# Patient Record
Sex: Male | Born: 1955 | Race: White | Hispanic: No | Marital: Married | Smoking: Never smoker
Health system: Southern US, Community
[De-identification: ages and names within clinical notes are randomized; demographics above are authoritative.]

---

## 2005-03-10 ENCOUNTER — Other Ambulatory Visit: Payer: Self-pay

## 2005-03-10 ENCOUNTER — Emergency Department: Payer: Self-pay | Admitting: Unknown Physician Specialty

## 2005-03-11 ENCOUNTER — Ambulatory Visit: Payer: Self-pay | Admitting: Unknown Physician Specialty

## 2007-08-11 IMAGING — CT CT ABD-PELV W/O CM
1 of 2 series · 15 of 32 positions shown, 19 images · non-contrast
Comparison: none

REASON FOR EXAM: Abdominal pain
COMMENTS:

[Series 2: stone · axial · 0.67mm/px · z∈[-480,-30]mm · 15 of 168 slices shown, 19 images]
[im 12/168  soft-tissue]
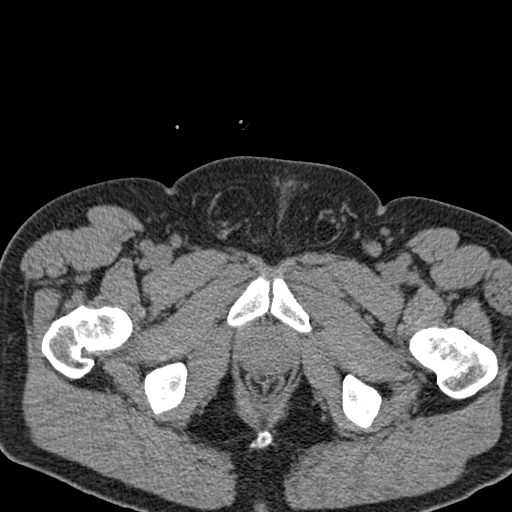
[im 12/168  bone]
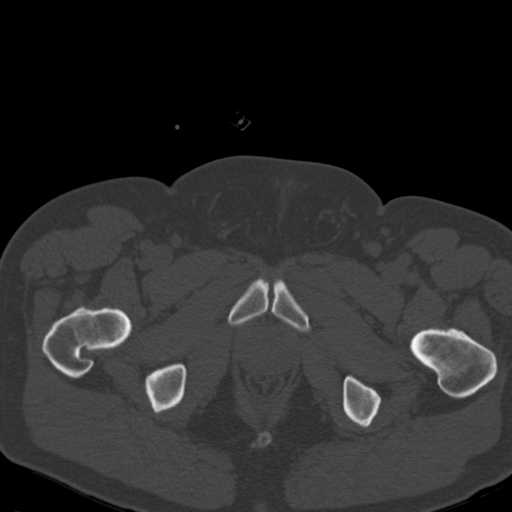
[im 24/168  soft-tissue]
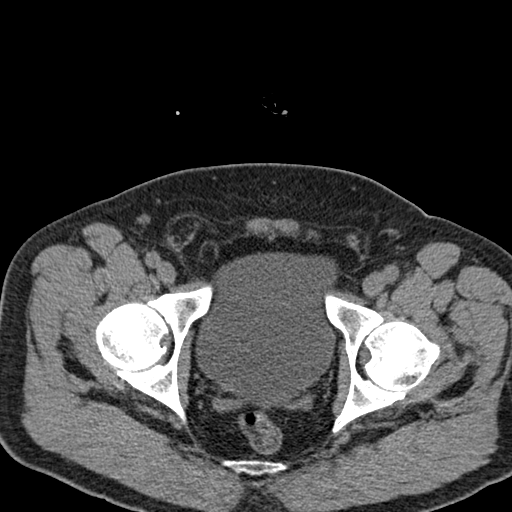
[im 36/168  soft-tissue]
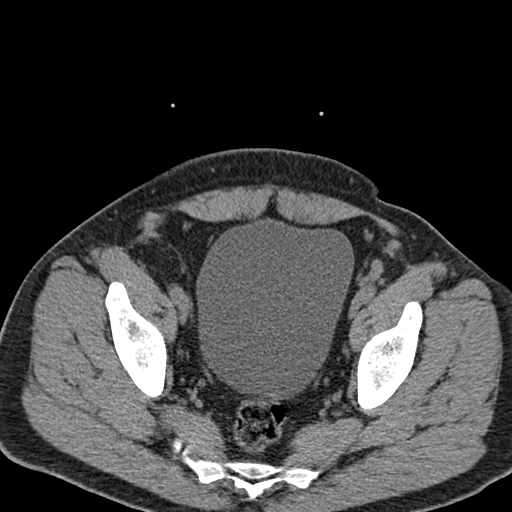
[im 48/168  soft-tissue]
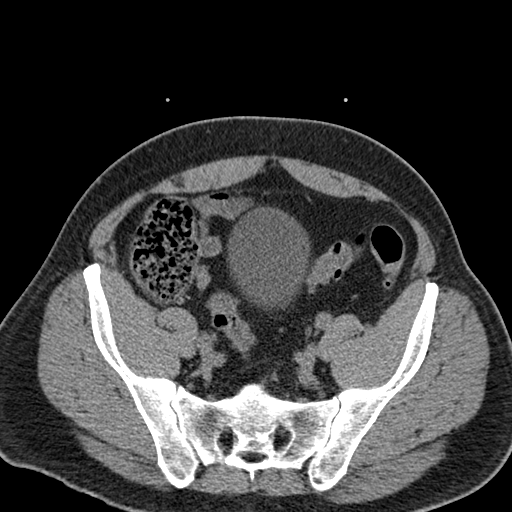
[im 60/168  soft-tissue]
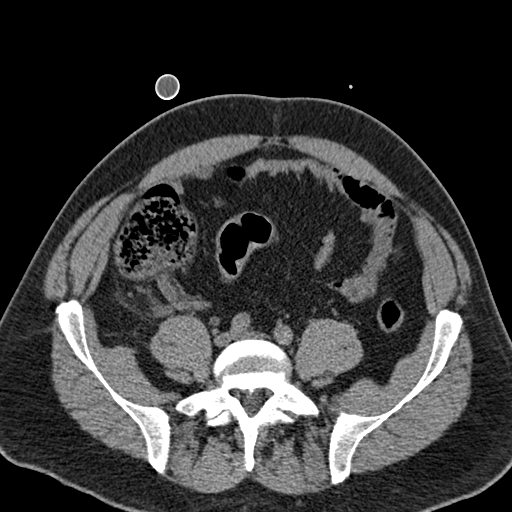
[im 72/168  soft-tissue]
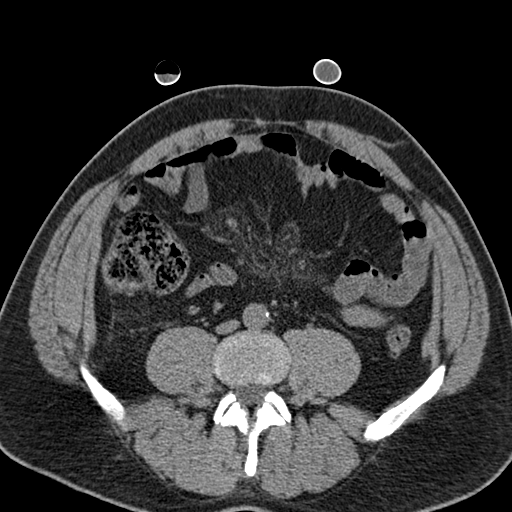
[im 84/168  soft-tissue]
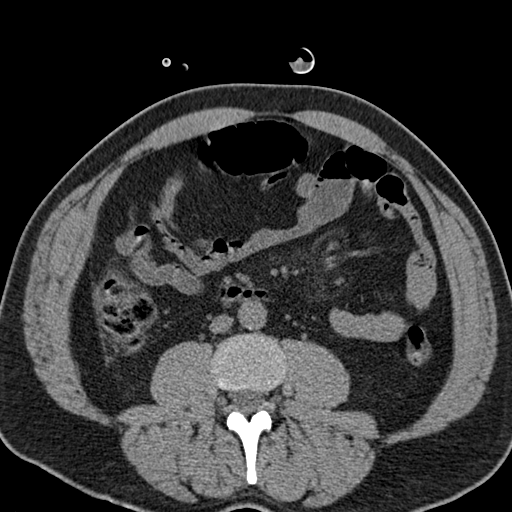
[im 96/168  soft-tissue]
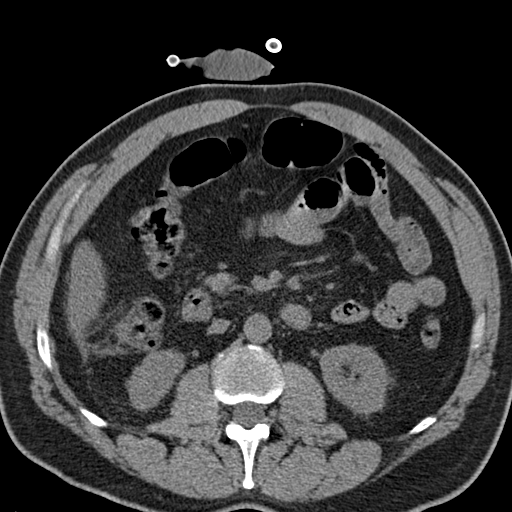
[im 108/168  soft-tissue]
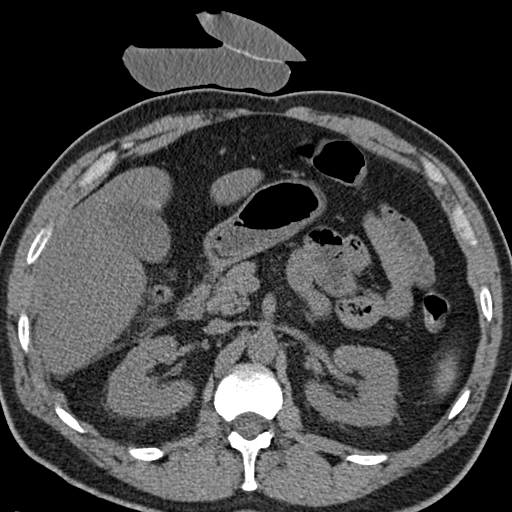
[im 108/168  bone]
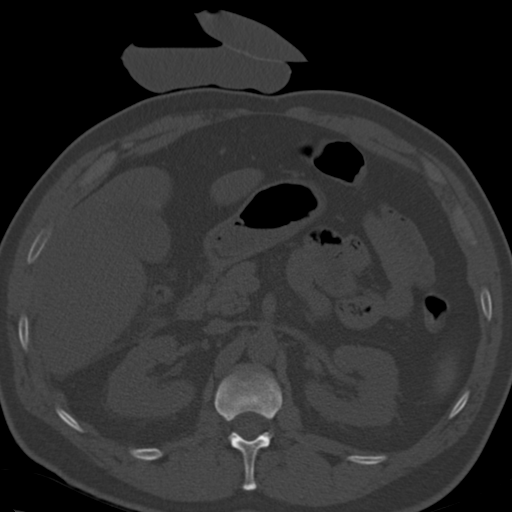
[im 120/168  soft-tissue]
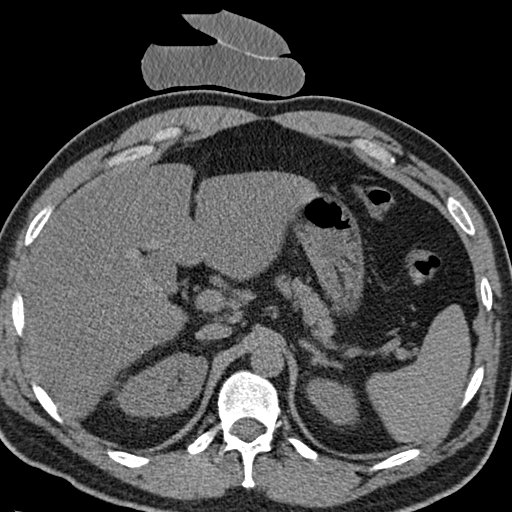
[im 132/168  soft-tissue]
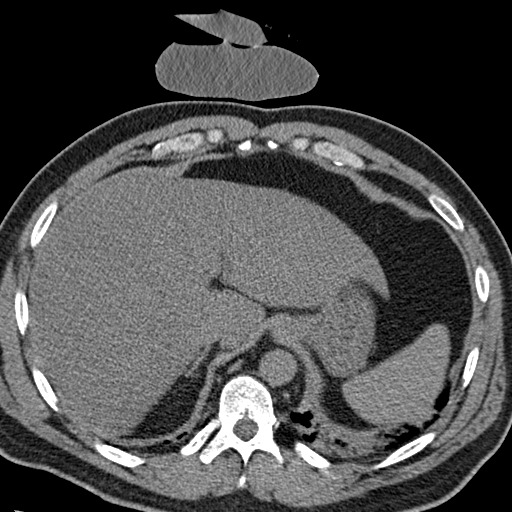
[im 144/168  soft-tissue]
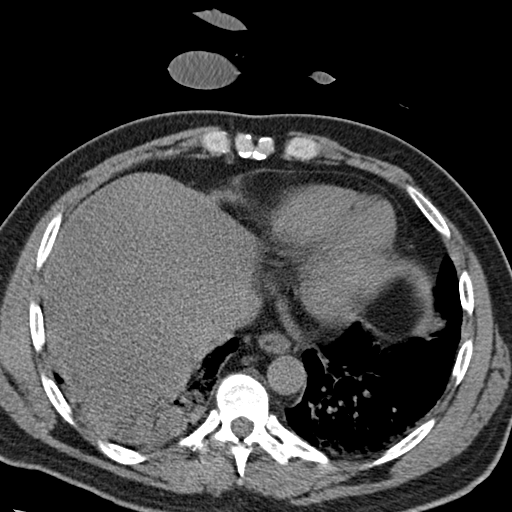
[im 144/168  lung]
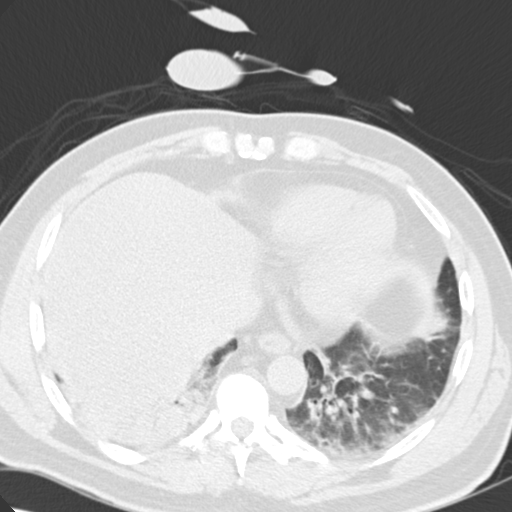
[im 150/168  lung]
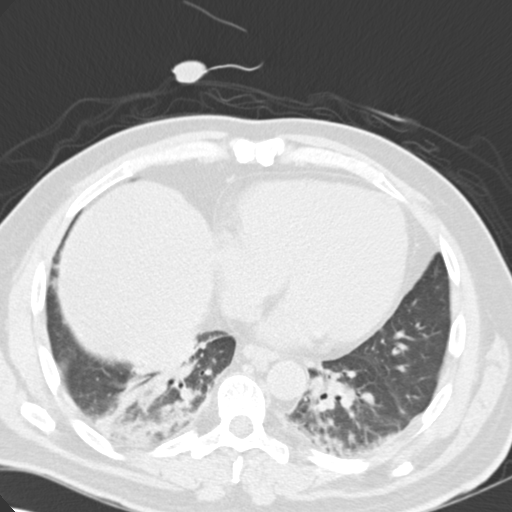
[im 156/168  soft-tissue]
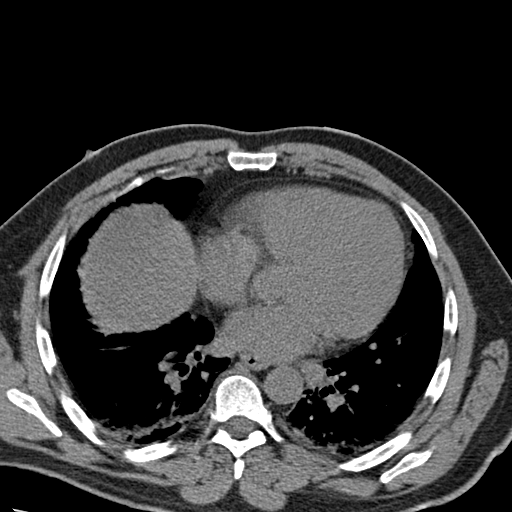
[im 156/168  lung]
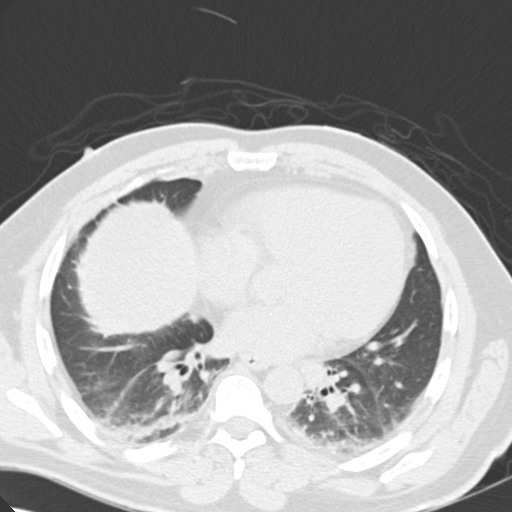
[im 162/168  lung]
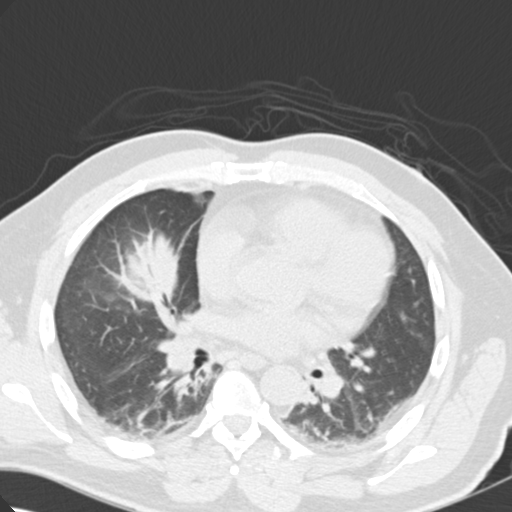

[15 of 32 positions shown; findings below may reference images not displayed]

PROCEDURE:     CT  - CT ABDOMEN AND PELVIS W[DATE]  [DATE]

RESULT:          Spiral noncontrasted 3 mm sections were obtained from the
lung bases through the pubic symphysis.

Evaluation of the lung bases demonstrates increased density within the RIGHT
and LEFT lung bases with patchy areas of consolidation.  These may represent
atelectasis or consolidative infiltrates.  There is also an element of
thickening of the interstitial markings which may represent an element of
pulmonary edema.

Noncontrasted evaluation of the liver, spleen, adrenals, and pancreas is
unremarkable.  Evaluation of the RIGHT and LEFT kidneys demonstrates no
evidence of hydronephrosis, masses or calculi.  There is evidence of
inflammatory change within the pericolonic mesenteric fat in the region of
the ascending colon at the level of the hepatic flexure.  There is also
evidence of haziness within the central mesenteric fat.  No drainable
loculated fluid collections or gross evidence of masses are appreciated.
IMPRESSION: Inflammatory change in the region of the ascending
colon extending from the pericolic gutter to the area of the hepatic
flexure.  This may represent an element of colitis, and clinical correlation
is recommended.  No drainable loculated fluid collections are appreciated.

A preliminary report was faxed to Dr. Hajiya Hikima, of the emergency
department,  on 03/10/2005, at [DATE] p.m. EST.

## 2015-03-18 ENCOUNTER — Ambulatory Visit (INDEPENDENT_AMBULATORY_CARE_PROVIDER_SITE_OTHER): Payer: Managed Care, Other (non HMO) | Admitting: Podiatry

## 2015-03-18 ENCOUNTER — Encounter: Payer: Self-pay | Admitting: Podiatry

## 2015-03-18 VITALS — BP 137/70 | HR 72 | Resp 18

## 2015-03-18 DIAGNOSIS — B351 Tinea unguium: Secondary | ICD-10-CM | POA: Diagnosis not present

## 2015-03-18 DIAGNOSIS — Q828 Other specified congenital malformations of skin: Secondary | ICD-10-CM

## 2015-03-18 NOTE — Patient Instructions (Signed)

## 2015-03-18 NOTE — Progress Notes (Signed)
   Subjective:    Patient ID: Derrick Stable., male    DOB: [birth year], 59 y.o.   MRN: [id number]  HPI  59 year old male presents the office they for concerns of calluses to both of his feet in the ball of his foot which are painful after he walks quite a bit. Denies any redness or drainage. No previous treatment. Also left big toe nails thick and discolored angular discussed treatment options for nail fungus. No pain to the toenailon any redness or drainage.   Review of Systems  All other systems reviewed and are negative.      Objective:   Physical Exam General: AAO x3, NAD  Dermatological: Bilateral submetatarsal hyperkeratotic lesions. Upon debridement no underlying ulceration, drainage or other signs of infection. No other open lesions or pre-ulcerative lesions. Left hallux toenails hypertrophic, dystrophic, discolored, brittle signs of an injured toenail with likely onychomycosis. No tenderness to palpation and redness or drainage.  Vascular: Dorsalis Pedis artery and Posterior Tibial artery pedal pulses are 2/4 bilateral with immedate capillary fill time. Pedal hair growth present. No varicosities and no lower extremity edema present bilateral. There is no pain with calf compression, swelling, warmth, erythema.   Neruologic: Grossly intact via light touch bilateral. Vibratory intact via tuning fork bilateral. Protective threshold with Semmes Wienstein monofilament intact to all pedal sites bilateral. Patellar and Achilles deep tendon reflexes 2+ bilateral. No Babinski or clonus noted bilateral.   Musculoskeletal: No gross boney pedal deformities bilateral. No pain, crepitus, or limitation noted with foot and ankle range of motion bilateral. Muscular strength 5/5 in all groups tested bilateral.  Gait: Unassisted, Nonantalgic.       Assessment & Plan:  59 year old male left hallux onychodystrophy, likely onychomycosis with submetatarsal hyperkeratotic lesions -Treatment options  discussed including all alternatives, risks, and complications -Etiology of symptoms were discussed -Nails sent for culture -Hyperkeratotic lesion sharply debrided 2 without complications/bleeding -Follow-up after nail culture or sooner if any problems arise. In the meantime, encouraged to call the office with any questions, concerns, change in symptoms.   Celesta Gentile, DPM

## 2015-03-22 ENCOUNTER — Encounter: Payer: Self-pay | Admitting: Podiatry

## 2015-04-15 ENCOUNTER — Ambulatory Visit: Payer: 59 | Admitting: Podiatry

## 2015-04-22 ENCOUNTER — Encounter: Payer: Self-pay | Admitting: Podiatry

## 2015-04-22 ENCOUNTER — Ambulatory Visit (INDEPENDENT_AMBULATORY_CARE_PROVIDER_SITE_OTHER): Payer: Managed Care, Other (non HMO) | Admitting: Podiatry

## 2015-04-22 VITALS — BP 151/81 | HR 69 | Resp 18

## 2015-04-22 DIAGNOSIS — B351 Tinea unguium: Secondary | ICD-10-CM

## 2015-04-22 DIAGNOSIS — L84 Corns and callosities: Secondary | ICD-10-CM | POA: Diagnosis not present

## 2015-04-22 MED ORDER — TERBINAFINE HCL 250 MG PO TABS: 250.0000 mg | ORAL_TABLET | Freq: Every day | ORAL | Status: DC

## 2015-04-22 NOTE — Progress Notes (Signed)
Patient ID: Derrick Douglas., male   DOB: 1955/12/01, 60 y.o.   MRN: [id number]  Subjective: 60 year old male presents the also discussed nail biopsy results. He states his nails continue with thick and discolored and causing irritation shoe gear. He also can use a callus of the ball of the feet with denies any redness or drainage or other signs of infection. He has purchased inserts which seem to help. He does continue metatarsal pads. Denies any systemic complaints such as fevers, chills, nausea, vomiting. No acute changes since last appointment, and no other complaints at this time.   Objective: AAO x3, NAD DP/PT pulses palpable bilaterally, CRT less than 3 seconds Protective sensation intact with Simms Weinstein monofilament Submetatarsal 2 hyperkeratotic lesions present. Upon debridement there is no underlying ulceration, drainage or other signs of infection. The nails but daily bilateral hallux and fifth digit nails are hypertrophic, dystrophic, brittle, discolored, elongated. There is tenderness overlying the nails 4. No swelling erythema or drainage in the nail sites. No areas of pinpoint bony tenderness or pain with vibratory sensation. MMT 5/5, ROM WNL. There is prominent metatarsal heads plantarly with atrophy of the fat pad. No edema, erythema, increase in warmth to bilateral lower extremities.  No open lesions or pre-ulcerative lesions.  No pain with calf compression, swelling, warmth, erythema  Assessment: Symptomatic onychomycosis, hyperkeratotic lesions  Plan: -All treatment options discussed with the patient including all alternatives, risks, complications.  -Discussed treatment options for onychomycosis and elected to proceed with topical treatment. Prescribed compound cream. -Hyperkeratotic lesions debrided 2 without complications or bleeding. Continue offloading pads. -Follow-up in the next several months or sooner if any issues are to arise. -Patient encouraged to call  the office with any questions, concerns, change in symptoms.   Celesta Gentile, DPM

## 2015-05-21 ENCOUNTER — Telehealth: Payer: Self-pay | Admitting: *Deleted

## 2015-05-21 NOTE — Telephone Encounter (Addendum)
Pt left name and phone number.  I spoke with pt, he states he was seen 03/18/2015, treated for callouses and thick toenail fungus, and Dr. Jacqualyn Posey recommended inserts to support his arches and relieve achilles pain. Pt states he bought the inserts and needs a letter to be reimbursed.  I reviewed pt's record and told him I would speak with Dr. Jacqualyn Posey and call again.  05/28/2015-LEFT MESSAGE REQUESTING ADDRESS to send the note pt requested.  Pt states he would like the note mailed to his home.  Done.

## 2015-05-27 ENCOUNTER — Encounter: Payer: Self-pay | Admitting: Podiatry

## 2015-05-27 NOTE — Telephone Encounter (Signed)
Note complete. Will give to you Friday.

## 2018-09-16 DIAGNOSIS — D2261 Melanocytic nevi of right upper limb, including shoulder: Secondary | ICD-10-CM | POA: Diagnosis not present

## 2018-09-16 DIAGNOSIS — D2272 Melanocytic nevi of left lower limb, including hip: Secondary | ICD-10-CM | POA: Diagnosis not present

## 2018-09-16 DIAGNOSIS — D225 Melanocytic nevi of trunk: Secondary | ICD-10-CM | POA: Diagnosis not present

## 2018-09-16 DIAGNOSIS — L821 Other seborrheic keratosis: Secondary | ICD-10-CM | POA: Diagnosis not present

## 2018-09-16 DIAGNOSIS — D2271 Melanocytic nevi of right lower limb, including hip: Secondary | ICD-10-CM | POA: Diagnosis not present

## 2018-09-16 DIAGNOSIS — D2262 Melanocytic nevi of left upper limb, including shoulder: Secondary | ICD-10-CM | POA: Diagnosis not present

## 2018-12-23 DIAGNOSIS — Z23 Encounter for immunization: Secondary | ICD-10-CM | POA: Diagnosis not present

## 2019-04-08 DIAGNOSIS — R739 Hyperglycemia, unspecified: Secondary | ICD-10-CM | POA: Diagnosis not present

## 2019-04-08 DIAGNOSIS — Z0000 Encounter for general adult medical examination without abnormal findings: Secondary | ICD-10-CM | POA: Diagnosis not present

## 2019-04-08 DIAGNOSIS — Z125 Encounter for screening for malignant neoplasm of prostate: Secondary | ICD-10-CM | POA: Diagnosis not present

## 2019-04-08 DIAGNOSIS — E559 Vitamin D deficiency, unspecified: Secondary | ICD-10-CM | POA: Diagnosis not present

## 2019-04-15 DIAGNOSIS — F17210 Nicotine dependence, cigarettes, uncomplicated: Secondary | ICD-10-CM | POA: Diagnosis not present

## 2019-04-15 DIAGNOSIS — Z0000 Encounter for general adult medical examination without abnormal findings: Secondary | ICD-10-CM | POA: Diagnosis not present

## 2019-04-15 DIAGNOSIS — Z23 Encounter for immunization: Secondary | ICD-10-CM | POA: Diagnosis not present

## 2019-04-15 DIAGNOSIS — E039 Hypothyroidism, unspecified: Secondary | ICD-10-CM | POA: Diagnosis not present

## 2019-04-15 DIAGNOSIS — E559 Vitamin D deficiency, unspecified: Secondary | ICD-10-CM | POA: Diagnosis not present

## 2019-04-15 DIAGNOSIS — Z136 Encounter for screening for cardiovascular disorders: Secondary | ICD-10-CM | POA: Diagnosis not present

## 2019-04-15 DIAGNOSIS — F419 Anxiety disorder, unspecified: Secondary | ICD-10-CM | POA: Diagnosis not present

## 2019-04-15 DIAGNOSIS — E785 Hyperlipidemia, unspecified: Secondary | ICD-10-CM | POA: Diagnosis not present

## 2019-04-15 DIAGNOSIS — K760 Fatty (change of) liver, not elsewhere classified: Secondary | ICD-10-CM | POA: Diagnosis not present

## 2019-04-15 DIAGNOSIS — R9431 Abnormal electrocardiogram [ECG] [EKG]: Secondary | ICD-10-CM | POA: Diagnosis not present

## 2019-04-15 DIAGNOSIS — R7303 Prediabetes: Secondary | ICD-10-CM | POA: Diagnosis not present

## 2019-04-22 DIAGNOSIS — Z87891 Personal history of nicotine dependence: Secondary | ICD-10-CM | POA: Diagnosis not present

## 2019-04-22 DIAGNOSIS — Z136 Encounter for screening for cardiovascular disorders: Secondary | ICD-10-CM | POA: Diagnosis not present

## 2019-04-24 DIAGNOSIS — R9431 Abnormal electrocardiogram [ECG] [EKG]: Secondary | ICD-10-CM | POA: Diagnosis not present

## 2019-05-01 DIAGNOSIS — R9439 Abnormal result of other cardiovascular function study: Secondary | ICD-10-CM | POA: Diagnosis not present

## 2019-05-01 DIAGNOSIS — E7800 Pure hypercholesterolemia, unspecified: Secondary | ICD-10-CM | POA: Diagnosis not present

## 2019-05-01 DIAGNOSIS — I10 Essential (primary) hypertension: Secondary | ICD-10-CM | POA: Diagnosis not present

## 2019-05-02 ENCOUNTER — Ambulatory Visit: Payer: Self-pay

## 2019-05-15 DIAGNOSIS — E7800 Pure hypercholesterolemia, unspecified: Secondary | ICD-10-CM | POA: Diagnosis not present

## 2019-08-14 DIAGNOSIS — R7303 Prediabetes: Secondary | ICD-10-CM | POA: Diagnosis not present

## 2019-08-14 DIAGNOSIS — E785 Hyperlipidemia, unspecified: Secondary | ICD-10-CM | POA: Diagnosis not present

## 2019-10-20 DIAGNOSIS — L603 Nail dystrophy: Secondary | ICD-10-CM | POA: Diagnosis not present

## 2019-10-20 DIAGNOSIS — B351 Tinea unguium: Secondary | ICD-10-CM | POA: Diagnosis not present

## 2019-10-20 DIAGNOSIS — L600 Ingrowing nail: Secondary | ICD-10-CM | POA: Diagnosis not present

## 2019-11-03 DIAGNOSIS — L603 Nail dystrophy: Secondary | ICD-10-CM | POA: Diagnosis not present

## 2019-11-03 DIAGNOSIS — L600 Ingrowing nail: Secondary | ICD-10-CM | POA: Diagnosis not present

## 2019-11-03 DIAGNOSIS — B351 Tinea unguium: Secondary | ICD-10-CM | POA: Diagnosis not present

## 2019-12-26 DIAGNOSIS — Z23 Encounter for immunization: Secondary | ICD-10-CM | POA: Diagnosis not present

## 2020-02-10 ENCOUNTER — Ambulatory Visit: Payer: Self-pay | Attending: Internal Medicine

## 2020-02-10 ENCOUNTER — Other Ambulatory Visit: Payer: Self-pay | Admitting: Internal Medicine

## 2020-02-10 DIAGNOSIS — Z23 Encounter for immunization: Secondary | ICD-10-CM

## 2020-02-10 NOTE — Progress Notes (Signed)
   [id number]  Name:  Derrick Douglas.    MRN: [id number] DOB: September 09, 1955  02/10/2020  [name] was observed post Covid-19 immunization for 15 minutes without incident. He was provided with Vaccine Information Sheet and instruction to access the V-Safe system.   [name] was instructed to call 911 with any severe reactions post vaccine: Marland Kitchen Difficulty breathing  . Swelling of face and throat  . A fast heartbeat  . A bad rash all over body  . Dizziness and weakness   Immunizations Administered    Name Date Dose VIS Date Route   Pfizer COVID-19 Vaccine 02/10/2020 11:28 AM 0.3 mL 01/07/2020 Intramuscular   Manufacturer: [place]   Lot: [id number]   [place]: [id number]

## 2023-05-23 ENCOUNTER — Other Ambulatory Visit: Payer: Self-pay | Admitting: Internal Medicine

## 2023-05-23 DIAGNOSIS — Z87891 Personal history of nicotine dependence: Secondary | ICD-10-CM

## 2023-06-05 ENCOUNTER — Ambulatory Visit
Admission: RE | Admit: 2023-06-05 | Discharge: 2023-06-05 | Disposition: A | Discharge status: Home / Self Care | Payer: Self-pay | Source: Ambulatory Visit | Attending: Internal Medicine | Admitting: Internal Medicine

## 2023-06-05 DIAGNOSIS — Z87891 Personal history of nicotine dependence: Secondary | ICD-10-CM | POA: Diagnosis present
